# Patient Record
Sex: Male | Born: 2000 | Race: Black or African American | Hispanic: No | Marital: Single | Smoking: Never smoker
Health system: Southern US, Community
[De-identification: ages and names within clinical notes are randomized; demographics above are authoritative.]

---
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
## 2020-02-02 ENCOUNTER — Other Ambulatory Visit: Payer: Self-pay

## 2020-02-02 ENCOUNTER — Encounter (HOSPITAL_COMMUNITY): Payer: Self-pay

## 2020-02-02 ENCOUNTER — Ambulatory Visit (HOSPITAL_COMMUNITY)
Admission: EM | Admit: 2020-02-02 | Discharge: 2020-02-02 | Disposition: A | Discharge status: Home / Self Care | Payer: Medicaid Other | Attending: Family Medicine | Admitting: Family Medicine

## 2020-02-02 DIAGNOSIS — Z20822 Contact with and (suspected) exposure to covid-19: Secondary | ICD-10-CM | POA: Diagnosis not present

## 2020-02-02 DIAGNOSIS — Z01812 Encounter for preprocedural laboratory examination: Secondary | ICD-10-CM | POA: Diagnosis not present

## 2020-02-02 DIAGNOSIS — Z1152 Encounter for screening for COVID-19: Secondary | ICD-10-CM

## 2020-02-02 LAB — SARS CORONAVIRUS 2 (TAT 6-24 HRS): SARS Coronavirus 2: NEGATIVE

## 2020-02-02 NOTE — ED Triage Notes (Signed)
Pt presents for covid testing for work with no known symptoms. 

## 2020-02-02 NOTE — Discharge Instructions (Signed)
If your Covid-19 test is positive, you will get a phone call from [facility] regarding your results. If your Covid-19 test is negative, you will NOT get a phone call from  with your results. You may view your results on MyChart. If you do not have a MyChart account, sign up instructions are in your discharge papers. ° °

## 2020-03-10 ENCOUNTER — Encounter (HOSPITAL_COMMUNITY): Payer: Self-pay | Admitting: Emergency Medicine

## 2020-03-10 ENCOUNTER — Other Ambulatory Visit: Payer: Self-pay

## 2020-03-10 ENCOUNTER — Ambulatory Visit (HOSPITAL_COMMUNITY)
Admission: EM | Admit: 2020-03-10 | Discharge: 2020-03-10 | Disposition: A | Discharge status: Home / Self Care | Payer: Medicaid Other | Attending: Family Medicine | Admitting: Family Medicine

## 2020-03-10 DIAGNOSIS — Z1152 Encounter for screening for COVID-19: Secondary | ICD-10-CM

## 2020-03-10 DIAGNOSIS — Z20822 Contact with and (suspected) exposure to covid-19: Secondary | ICD-10-CM | POA: Insufficient documentation

## 2020-03-10 LAB — SARS CORONAVIRUS 2 (TAT 6-24 HRS): SARS Coronavirus 2: NEGATIVE

## 2020-03-10 NOTE — ED Triage Notes (Signed)
Pt presents for COVID test. States was exposed at work. Denies any symptoms at this time.

## 2020-03-10 NOTE — Discharge Instructions (Signed)

## 2020-04-21 DIAGNOSIS — Z20822 Contact with and (suspected) exposure to covid-19: Secondary | ICD-10-CM | POA: Diagnosis not present

## 2020-08-03 ENCOUNTER — Ambulatory Visit (HOSPITAL_COMMUNITY)
Admission: EM | Admit: 2020-08-03 | Discharge: 2020-08-03 | Disposition: A | Discharge status: Home / Self Care | Payer: Medicaid Other | Attending: Medical Oncology | Admitting: Medical Oncology

## 2020-08-03 ENCOUNTER — Encounter (HOSPITAL_COMMUNITY): Payer: Self-pay | Admitting: Emergency Medicine

## 2020-08-03 DIAGNOSIS — Z113 Encounter for screening for infections with a predominantly sexual mode of transmission: Secondary | ICD-10-CM | POA: Insufficient documentation

## 2020-08-03 LAB — HIV ANTIBODY (ROUTINE TESTING W REFLEX): HIV Screen 4th Generation wRfx: NONREACTIVE

## 2020-08-03 NOTE — ED Triage Notes (Signed)
Pt is present today for STD testing. Pt denies any sx  

## 2020-08-03 NOTE — ED Provider Notes (Signed)
[facility]    CSN: [id number] Arrival date & time: 08/03/20  4580      History   Chief Complaint Chief Complaint  Patient presents with   SEXUALLY TRANSMITTED DISEASE    HPI Benjamin Pacheco is a 20 y.o. male.   HPI  STI screening: Patient reports for STI screening.  He denies any known contacts or flulike symptoms, penile discharge or pelvic pain.  History reviewed. No pertinent past medical history.  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 No pertinent surgical history.    Home Medications    Prior to Admission medications   Medication Sig Start Date End Date Taking? Authorizing Provider  metroNIDAZOLE (FLAGYL) 500 MG tablet Take 1 tablet (500 mg total) by mouth 2 (two) times daily. 12/07/19   Merrilee Jansky, MD  fluticasone (FLONASE) 50 MCG/ACT nasal spray Place 2 sprays into both nostrils daily. 08/02/19 10/28/19  Meccariello, Solmon Ice, DO    Family History Family History  Family history unknown: Yes    Social History Social History   Tobacco Use   Smoking status: Never   Smokeless tobacco: Never  Substance Use Topics   Alcohol use: No    Alcohol/week: 0.0 standard drinks   Drug use: No     Allergies   Patient has no known allergies.   Review of Systems Review of Systems  As stated above in HPI Physical Exam Triage Vital Signs ED Triage Vitals [08/03/20 0922]  Enc Vitals Group     BP 118/75     Pulse Rate 67     Resp 18     Temp 98.5 F (36.9 C)     Temp Source Oral     SpO2 100 %     Weight      Height      Head Circumference      Peak Flow      Pain Score 0     Pain Loc      Pain Edu?      Excl. in GC?    No data found.  Updated Vital Signs BP 118/75 (BP Location: Right  Arm)   Pulse 67   Temp 98.5 F (36.9 C) (Oral)   Resp 18   SpO2 100%   Physical Exam Vitals and nursing note reviewed.  Constitutional:      General: He is not in acute distress.    Appearance: Normal appearance. He is not ill-appearing, toxic-appearing or diaphoretic.  Cardiovascular:     Rate and Rhythm: Normal rate and regular rhythm.     Heart sounds: Normal heart sounds.  Pulmonary:     Effort: Pulmonary effort is normal.     Breath sounds: Normal breath sounds.  Lymphadenopathy:     Cervical: No cervical adenopathy.  Neurological:     Mental Status: He is alert.     UC Treatments / Results  Labs (all labs ordered are listed, but only abnormal results are displayed) Labs Reviewed - No data to display  EKG   Radiology No results found.  Procedures Procedures (including critical care time)  Medications Ordered in UC Medications - No data to display  Initial Impression / Assessment and Plan / UC Course  I have  reviewed the triage vital signs and the nursing notes.  Pertinent labs & imaging results that were available during my care of the patient were reviewed by me and considered in my medical decision making (see chart for details).     New.  Screening pending.  Discussed safe sex practices with patient. Final Clinical Impressions(s) / UC Diagnoses   Final diagnoses:  None   Discharge Instructions   None    ED Prescriptions   None    PDMP not reviewed this encounter.   Rushie Chestnut, [place] 08/03/20 1012

## 2020-08-04 LAB — RPR: RPR Ser Ql: NONREACTIVE

## 2020-08-05 LAB — CYTOLOGY, (ORAL, ANAL, URETHRAL) ANCILLARY ONLY
Chlamydia: NEGATIVE
Comment: NEGATIVE
Comment: NEGATIVE
Comment: NORMAL
Neisseria Gonorrhea: NEGATIVE
Trichomonas: NEGATIVE

## 2020-10-02 ENCOUNTER — Ambulatory Visit (INDEPENDENT_AMBULATORY_CARE_PROVIDER_SITE_OTHER): Payer: Medicaid Other | Admitting: Family Medicine

## 2020-10-02 ENCOUNTER — Other Ambulatory Visit: Payer: Self-pay

## 2020-10-02 VITALS — BP 112/73 | HR 71 | Temp 99.0°F | Ht 69.0 in

## 2020-10-02 DIAGNOSIS — J069 Acute upper respiratory infection, unspecified: Secondary | ICD-10-CM

## 2020-10-02 DIAGNOSIS — L700 Acne vulgaris: Secondary | ICD-10-CM | POA: Diagnosis not present

## 2020-10-02 MED ORDER — CLINDAMYCIN PHOS-BENZOYL PEROX 1.2-5 % EX GEL: CUTANEOUS | 0 refills | Status: DC

## 2020-10-02 NOTE — Patient Instructions (Addendum)
It was great to see you! Thank you for allowing me to participate in your care!  Our plans for today:  -We are checking COVID-19 testing.  We should get the result back in 1 to 2 days and I will let you know the result when it returns. -If you develop any chest pains, trouble breathing, shortness of breath, vomiting to the extent that she cannot keep down fluids, or any other concerning symptoms I would like you to immediately let us know or go to the emergency department. -I am prescribing a acne cream for you to use on your face twice per day.  I would like for you to follow-up with [place] in 1 to 2 months to evaluate how it is working.  If it is not working well we can always increase the potency of the medication.   Take care and seek immediate care sooner if you develop any concerns.   Dr. Jackelyn Poling, DO [facility]

## 2020-10-02 NOTE — Progress Notes (Signed)
    SUBJECTIVE:   CHIEF COMPLAINT / HPI:   Cold-like symptoms: 20 year old male with cough, congestion, runny nose that started on Sunday.  He denies fevers, chills, vomiting, diarrhea.  He does not know of any sick contacts.  Acne vulgaris Patient with a history of acne.  States he has been breaking out worse during this cold-like event.  He is interested in starting a medication for his acne.  Physical exam is consistent with acne vulgaris.  PERTINENT  PMH / PSH: None relevant  OBJECTIVE:   BP 112/73   Pulse 71   Temp 99 F (37.2 C) (Oral)   Ht 5\' 9"  (1.753 m)   SpO2 100%    General: NAD, pleasant, able to participate in exam HEENT: No pharyngeal erythema, moist mucous membranes Cardiac: RRR, no murmurs. Respiratory: CTAB, normal effort, No wheezes, rales or rhonchi Abdomen: Bowel sounds present, nontender Skin: Acne vulgaris present on the cheeks bilaterally with whiteheads and blackheads present, minimal to no scarring present.  ASSESSMENT/PLAN:   Acne vulgaris We will start with BenzaClin gel.  I recommended she follow-up in about 1 to 2 months to evaluate how it is working.  We can always increase the treatment if needed.   Viral URI with cough: Assessment: 20 y.o. male with symptoms consistent with a viral upper respiratory tract infection.  Patient does not have any concerning symptoms.  No shortness of breath.  Overall differential can include seasonal allergies with postnasal drip leading to the cough and runny nose versus viral URI which can include common cold, influenza, COVID-19, or number of other respiratory viruses.  Unlikely bacterial at this time though the patient could potentially develop sinus infection due to congestion. Plan: -We will send for COVID-19 testing -Discussed return precautions -Discussed symptomatic treatment and the lack of need for antibiotics.  26, DO [facility]

## 2020-10-02 NOTE — Assessment & Plan Note (Signed)
We will start with BenzaClin gel.  I recommended she follow-up in about 1 to 2 months to evaluate how it is working.  We can always increase the treatment if needed.

## 2020-10-04 ENCOUNTER — Telehealth: Payer: Self-pay | Admitting: Family Medicine

## 2020-10-04 LAB — NOVEL CORONAVIRUS, NAA: SARS-CoV-2, NAA: DETECTED — AB

## 2020-10-04 LAB — SARS-COV-2, NAA 2 DAY TAT

## 2020-10-04 NOTE — Telephone Encounter (Signed)
Attempted to call patient to discuss positive COVID-19 result.  Was not able to get the patient so left a HIPAA compliant voicemail stating that I will send a MyChart message in regard to the phone call.  I have sent the patient MyChart message recommending isolation per CDC guidelines.  Also recommended patient follow-up if he has any questions and provide return precautions.

## 2021-03-08 ENCOUNTER — Encounter (HOSPITAL_COMMUNITY): Payer: Self-pay

## 2021-03-08 ENCOUNTER — Ambulatory Visit (HOSPITAL_COMMUNITY)
Admission: EM | Admit: 2021-03-08 | Discharge: 2021-03-08 | Disposition: A | Discharge status: Home / Self Care | Payer: Medicaid Other | Attending: Urgent Care | Admitting: Urgent Care

## 2021-03-08 ENCOUNTER — Other Ambulatory Visit: Payer: Self-pay

## 2021-03-08 DIAGNOSIS — Z7251 High risk heterosexual behavior: Secondary | ICD-10-CM | POA: Diagnosis not present

## 2021-03-08 DIAGNOSIS — Z202 Contact with and (suspected) exposure to infections with a predominantly sexual mode of transmission: Secondary | ICD-10-CM | POA: Insufficient documentation

## 2021-03-08 MED ORDER — DOXYCYCLINE HYCLATE 100 MG PO CAPS: 100.0000 mg | ORAL_CAPSULE | Freq: Two times a day (BID) | ORAL | 0 refills | Status: DC

## 2021-03-08 NOTE — ED Triage Notes (Signed)
Pt presents for STD screening.   States he does not have sxs.   States he wants to take pills for treatment.

## 2021-03-08 NOTE — Discharge Instructions (Addendum)
Avoid all forms of sexual intercourse (oral, vaginal, anal) for the next 7 days to avoid spreading/reinfecting or at least until we can see what kinds of infection results are positive. Return if symptoms worsen/do not resolve, you develop fever, abdominal pain, blood in your urine, or are re-exposed to a sexually transmitted infection (STI).  

## 2021-03-08 NOTE — ED Provider Notes (Signed)
°  [place]   MRN: [id number] DOB: 2000-12-14  Subjective:   Benjamin Pacheco is a 21 y.o. male presenting for STI treatment.  Had exposure to chlamydia.  Uses condoms for protection but the last time he had sex, it broke.  His partner ended up getting tested and she was positive for chlamydia. Denies dysuria, hematuria, urinary frequency, penile discharge, penile swelling, testicular pain, testicular swelling, anal pain, groin pain.   No current facility-administered medications for this encounter.  Current Outpatient Medications:    Clindamycin-Benzoyl Per, Refr, gel, Use twice daily for acne, Disp: 45 g, Rfl: 0   metroNIDAZOLE (FLAGYL) 500 MG tablet, Take 1 tablet (500 mg total) by mouth 2 (two) times daily., Disp: 14 tablet, Rfl: 0   No Known Allergies  History reviewed. No pertinent past medical history.   History reviewed. No pertinent surgical history.  Family History  Family history unknown: Yes    Social History   Tobacco Use   Smoking status: Never   Smokeless tobacco: Never  Substance Use Topics   Alcohol use: No    Alcohol/week: 0.0 standard drinks   Drug use: No    ROS   Objective:   Vitals: BP 113/71 (BP Location: Left Arm)    Pulse 80    Temp 98.1 F (36.7 C) (Oral)    Resp 18    SpO2 97%   Physical Exam Constitutional:      General: He is not in acute distress.    Appearance: Normal appearance. He is well-developed and normal weight. He is not ill-appearing, toxic-appearing or diaphoretic.  HENT:     Head: Normocephalic and atraumatic.     Right Ear: External ear normal.     Left Ear: External ear normal.     Nose: Nose normal.     Mouth/Throat:     Pharynx: Oropharynx is clear.  Eyes:     General: No scleral icterus.       Right eye: No discharge.        Left eye: No discharge.     Extraocular Movements: Extraocular movements intact.  Cardiovascular:     Rate and Rhythm: Normal rate.  Pulmonary:     Effort: Pulmonary  effort is normal.  Musculoskeletal:     Cervical back: Normal range of motion.  Neurological:     Mental Status: He is alert and oriented to person, place, and time.  Psychiatric:        Mood and Affect: Mood normal.        Behavior: Behavior normal.        Thought Content: Thought content normal.        Judgment: Judgment normal.    Assessment and Plan :   PDMP not reviewed this encounter.  1. Unprotected sex   2. Exposure to chlamydia    Given his exposure will treat for chlamydia with doxycycline.  Recommended abstaining for a week.  Labs pending, will treat as appropriate. Counseled patient on potential for adverse effects with medications prescribed/recommended today, ER and return-to-clinic precautions discussed, patient verbalized understanding.    Jaynee Eagles, PA-C 03/08/21 1423

## 2021-03-11 LAB — CYTOLOGY, (ORAL, ANAL, URETHRAL) ANCILLARY ONLY
Chlamydia: POSITIVE — AB
Comment: NEGATIVE
Comment: NEGATIVE
Comment: NORMAL
Neisseria Gonorrhea: NEGATIVE
Trichomonas: NEGATIVE

## 2021-05-21 NOTE — Progress Notes (Addendum)
? ? ?  SUBJECTIVE:  ? ?CHIEF COMPLAINT / HPI:  ? ?Encounter for physical exam: ?21 year old male present for encounter for physical exam prior to joining the [employer]. He takes no medications and has no past medical history. He has never had a surgery. He denies alcohol, tobacco, or drugs.  He denies any concerns for the need for STD testing.  He denies any history of syncope during athletic events.  States he did play sports in high school.  Denies any history of any family members dropping dead suddenly during athletics. ? ?States he had a heart murmur as a child but saw a cardiologist and was cleared. Additionally he had a wrist fracture as a child which healed well and has had no complications from it.  ? ?PERTINENT  PMH / PSH: None ? ?OBJECTIVE:  ? ?BP 110/70   Pulse 63   Ht 5\' 9"  (1.753 m)   Wt 165 lb (74.8 kg)   SpO2 100%   BMI 24.37 kg/m?   ? ?General: NAD, pleasant, able to participate in exam ?HEENT: No pharyngeal erythema, no cervical lymphadenopathy ?Cardiac: RRR, no murmurs ?Respiratory: CTAB, normal effort, No wheezes, rales or rhonchi ?Abdomen: Bowel sounds present, nontender, nondistended, no hepatosplenomegaly. ?Extremities: no edema or cyanosis. ?MSK: Normal bulk and tone, no pain with palpation of either wrist, strength 5/5 in upper lower extremities bilaterally ?Neuro: alert, no obvious focal deficits ?Psych: Normal affect and mood ? ?ASSESSMENT/PLAN:  ? ?  ?Annual physical: ?21 year old male present for annual physical.  He has no concerns today.  He states he is having physical because he is about to join [employer] and needs to have a doctor's physical as well as his medical records.  States that when he was young he had a heart murmur and saw pediatric cardiologist and was cleared.  Additionally states he fractured his wrist years ago and also saw a physician with [facility] and was cleared.  He states that he does need records from these events but is unsure which physician with  which group he saw.  He states he may have seen at [facility] cardiologist for the heart murmur.  I do not hear a heart murmur on him today.  He states the [facility] does need these records.  I discussed that I can release records related to him at our clinic by having him fill out a form upfront but that he would need to reach out to the doctors that saw him for the other concerns to get their records if that is with the [employer] is required.  He denies any other concerns. ?Per recommendations we will screen for hepatitis C. ? ?[employer], DO ?[facility]  ? ? ? ?

## 2021-05-22 ENCOUNTER — Encounter: Payer: Self-pay | Admitting: Family Medicine

## 2021-05-22 ENCOUNTER — Ambulatory Visit (INDEPENDENT_AMBULATORY_CARE_PROVIDER_SITE_OTHER): Payer: Medicaid Other | Admitting: Family Medicine

## 2021-05-22 ENCOUNTER — Telehealth: Payer: Self-pay | Admitting: Student

## 2021-05-22 VITALS — BP 110/70 | HR 63 | Ht 69.0 in | Wt 165.0 lb

## 2021-05-22 DIAGNOSIS — Z0000 Encounter for general adult medical examination without abnormal findings: Secondary | ICD-10-CM | POA: Diagnosis not present

## 2021-05-22 NOTE — Patient Instructions (Signed)
For your records we can release only the records relating to this clinic.  You can talk with the front desk to fill out a records release form for that.  You will need to see your doctor who cleared you from the previous 2 items (your wrist fracture and your heart murmur) to get their documentation.  We will screen for hepatitis C today as is recommended for adults. ?

## 2021-05-22 NOTE — Telephone Encounter (Signed)
Pt has a history of heart murmur and needs a referral to an adult heart specialist. Please mother of patient [phone]. ?

## 2021-05-23 LAB — HCV INTERPRETATION

## 2021-05-23 LAB — HCV AB W REFLEX TO QUANT PCR: HCV Ab: NONREACTIVE

## 2021-05-24 ENCOUNTER — Other Ambulatory Visit: Payer: Self-pay | Admitting: Student

## 2021-05-24 ENCOUNTER — Encounter: Payer: Self-pay | Admitting: Student

## 2021-05-24 DIAGNOSIS — Z8679 Personal history of other diseases of the circulatory system: Secondary | ICD-10-CM

## 2021-05-24 NOTE — Telephone Encounter (Signed)
Patient has returned saying he needs the referral, hopefully, can get an appointment by Monday. Need for [employer] entry.    ?

## 2021-05-27 DIAGNOSIS — M25531 Pain in right wrist: Secondary | ICD-10-CM | POA: Diagnosis not present

## 2021-05-28 ENCOUNTER — Encounter: Payer: Self-pay | Admitting: Internal Medicine

## 2021-05-28 ENCOUNTER — Ambulatory Visit (INDEPENDENT_AMBULATORY_CARE_PROVIDER_SITE_OTHER): Payer: Medicaid Other | Admitting: Internal Medicine

## 2021-05-28 VITALS — BP 116/62 | HR 52 | Ht 69.0 in | Wt 167.2 lb

## 2021-05-28 DIAGNOSIS — Q231 Congenital insufficiency of aortic valve: Secondary | ICD-10-CM

## 2021-05-28 DIAGNOSIS — Q245 Malformation of coronary vessels: Secondary | ICD-10-CM | POA: Diagnosis not present

## 2021-05-28 LAB — BASIC METABOLIC PANEL
BUN/Creatinine Ratio: 15 (ref 9–20)
BUN: 12 mg/dL (ref 6–20)
CO2: 27 mmol/L (ref 20–29)
Calcium: 10 mg/dL (ref 8.7–10.2)
Chloride: 99 mmol/L (ref 96–106)
Creatinine, Ser: 0.81 mg/dL (ref 0.76–1.27)
Glucose: 82 mg/dL (ref 70–99)
Potassium: 4.7 mmol/L (ref 3.5–5.2)
Sodium: 138 mmol/L (ref 134–144)
eGFR: 129 mL/min/{1.73_m2} (ref >59)

## 2021-05-28 NOTE — Progress Notes (Signed)
?Cardiology Office Note:   ? ?Date:  05/28/2021  ? ?ID:  Benjamin Pacheco, DOB [birth year], MRN [id number] ? ?PCP:  Gerrit Heck, MD ?  ?[facility] Providers ?Cardiologist:  Janina Mayo, MD    ? ?Referring MD: Gerrit Heck, MD  ? ?No chief complaint on file. ?Military Clearance ? ?History of Present Illness:   ? ?Benjamin Pacheco is a 21 y.o. male with a hx of BAV, and anomalous origin of the RCA previously followed by [place] [facility] last seen 09/2016, referral from PCP for clearance for the [employer] ? ?He is Asymptomatic. No other medical problems. He had a heart murmur per his mother.  ? ?Patient of [facility]. 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  He did not have any aortic stenosis, insufficiency, or dilation of the ascending aorta at the time of his diagnosis. The right coronary artery was noted to have a high leftward origin. He did undergo cardiac stress testing that was performed at [facility] on 10/19/15. He had moderately reduced functional capacity that was felt to be due to airway disease. FEV1 showed 26% reduction after exercise and remained reduced for at least 20 minutes. He did not have any chest pain. There was no ST segment changes or arrhythmias noted. He did undergo cardiac MRI on 11/13/15. Due to the fusion between his right and left thousand difficulty determining the precise location raphae on MRI the precise location of his right coronary artery was difficult to determine. The coronary artery did have an anterior origin off of the aorta there was high and leftward. Considering CMR was not optimal for assessment of anomalous RCA a cardiac CT was recommended. Don't see the cardiac CT. ? ?He played soccer, wrestling, basketball and volleyball and had no issues. No hx of asthma per patient ? ?Family Hx: No heart  disease history. Sbiling with asthma ? ?Social Hx; No smoking cigarettes, no drug use ? ? ?TTE- Pediatric Echo 10/16/2016 ? ? Functionally bicuspid aortic valve. ? No aortic stenosis. ? No aortic insufficiency. ? 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 ? ?CARDIAC POSITION ? Levocardia. Abdominal situs solitus. Atrial situs solitus. D Ventricular Loop. S Normal ? position great vessels. ? ?VEINS ? Normal systemic venous connections. One right and one left sided pulmonary vein are ? confirmed draining normally to the left atrium. Normal pulmonary vein velocity. ? ?ATRIA ? Normal right atrial size. Normal left atrial size. The atrial septum is not well seen in ? this study, cannot rule out small defects or a patent foramen ovale. ? ?ATRIOVENTRICULAR VALVES ? Normal tricuspid valve. No tricuspid valve stenosis. Trace tricuspid valve ? regurgitation. The TR peak gradient = 22 mmHg. Normal mitral valve. No mitral valve ? stenosis. Trace mitral valve regurgitation. ? ?VENTRICLES ? Normal right ventricle structure and size. Normal left ventricle structure and size. ? Intact ventricular septum. ? ?CARDIAC FUNCTION ? Normal right ventricular systolic function. Normal left ventricular systolic function. ? ?SEMILUNAR VALVES ? Normal pulmonic valve. Normal pulmonic valve velocity. Trivial pulmonary valve ? insufficiency. There is partial fusion of the right/left commissure of the aortic valve. ? No aortic valve insufficiency by color Doppler. The peak instantaneous pressure gradient ? across the aortic valve by Doppler is 13 mm Hg. ? ?CORONARY ARTERIES ? The right coronary is seen,  but the ostium is not well defined. Normal origin and ? proximal course of the left coronary artery with prograde flow demonstrated by color ? Doppler. ? ?GREAT ARTERIES ? Left aortic arch with normal branching pattern. No evidence of coarctation of the aorta. ? No aortic root dilation. There is normal pulsatility of the  abdominal aorta. Normal main ? pulmonary artery and pulmonary artery branches. ? ?SHUNTS ? No patent ductus arteriosus. ? ?EXTRACARDIAC ? No pericardial effusion. ? ? ? ?Current Medications: ?Current Outpatient Medications on File Prior to Visit  ?Medication Sig Dispense Refill  ? [DISCONTINUED] fluticasone (FLONASE) 50 MCG/ACT nasal spray Place 2 sprays into both nostrils daily. 16 g 0  ? ?No current facility-administered medications on file prior to visit.  ? ? ? ?Allergies:   Patient has no known allergies.  ? ?Social History  ? ?Socioeconomic History  ? Marital status: Single  ?  Spouse name: Not on file  ? Number of children: Not on file  ? Years of education: Not on file  ? Highest education level: Not on file  ?Occupational History  ? Not on file  ?Tobacco Use  ? Smoking status: Never  ? Smokeless tobacco: Never  ?Substance and Sexual Activity  ? Alcohol use: No  ?  Alcohol/week: 0.0 standard drinks  ? Drug use: No  ? Sexual activity: Not on file  ?Other Topics Concern  ? Not on file  ?Social History Narrative  ? Not on file  ? ?Social Determinants of Health  ? ?Financial Resource Strain: Not on file  ?Food Insecurity: Not on file  ?Transportation Needs: Not on file  ?Physical Activity: Not on file  ?Stress: Not on file  ?Social Connections: Not on file  ?  ? ?Family History: ?The patient's  ? ? High blood pressure (Hypertension) Father  ? Diabetes Father  ? Iron deficiency Sister  ? Cancer Maternal Grandmother  ?Lung  ? Alzheimer's disease Maternal Grandfather  ? Sudden death (unexpected death due to unknown cause) Maternal Uncle 3  ? ?ROS:   ?Please see the history of present illness.    ? All other systems reviewed and are negative. ? ?EKGs/Labs/Other Studies Reviewed:   ? ?The following studies were reviewed today: ? ? ?EKG:  EKG is  ordered today.  The ekg ordered today demonstrates  ? ?EKG 05/28/2021- NSR, septal q ? ?Recent Labs: ?No results found for requested labs within last 8760 hours.  ?Recent Lipid  Panel ?No results found for: CHOL, TRIG, HDL, CHOLHDL, VLDL, LDLCALC, LDLDIRECT ? ? ?Risk Assessment/Calculations:   ?  ? ?    ? ?Physical Exam:   ? ?VS:  ? ?Vitals:  ? 05/28/21 1025  ?BP: 116/62  ?Pulse: (!) 52  ?SpO2: 99%  ? ? ? ?Wt Readings from Last 3 Encounters:  ?05/28/21 167 lb 3.2 oz (75.8 kg)  ?05/22/21 165 lb (74.8 kg)  ?11/20/17 151 lb (68.5 kg) (58 %, Z= 0.21)*  ? ?* Growth percentiles are based on CDC (Boys, 2-20 Years) data.  ?  ? ?GEN:  Well nourished, well developed in no acute distress ?HEENT: Normal ?NECK: No JVD; No carotid bruits ?LYMPHATICS: No lymphadenopathy ?CARDIAC: RRR, no murmurs, rubs, gallops ?RESPIRATORY:  Clear to auscultation without rales, wheezing or rhonchi  ?ABDOMEN: Soft, non-tender, non-distended ?MUSCULOSKELETAL:  No edema; No deformity  ?SKIN: Warm and dry ?NEUROLOGIC:  Alert and oriented x 3 ?PSYCHIATRIC:  Normal affect  ? ?ASSESSMENT:   ? ?#BAV: noted to have partial fusion of L/R  coronary cusp. No systolic murmur on exam. He is asymptomatic. Will plan for TTE for surveillance. ? ?#Anomalous RCA: still requires further evaluation. Do not see cardiac CT done at [facility]. Will plan to get a cardiac CT to assess. It is typically benign condition. It is reassuring his stress testing was not c/f ischemia. ? ?If his cardiac CT shows benign anomalous RCA and BAV does not show significant obstruction then he can be cleared for [employer] with recommendation for continued cardiac surveillance.  ? ? ?PLAN:   ? ?In order of problems listed above: ? ?TTE ?Cardiac CTA ?Follow up 6 months ? ?   ? ?   ?Medication Adjustments/Labs and Tests Ordered: ?Current medicines are reviewed at length with the patient today.  Concerns regarding medicines are outlined above.  ?Orders Placed This Encounter  ?Procedures  ? CT CORONARY MORPH W/CTA COR W/SCORE W/CA W/CM &/OR WO/CM  ? Basic metabolic panel  ? ECHOCARDIOGRAM COMPLETE  ? ?No orders of the defined types were placed in this  encounter. ? ? ?Patient Instructions  ?Medication Instructions:  ?No Changes In Medications at this time.  ?*If you need a refill on your cardiac medications before your next appointment, please call your pharmacy* ? ?Lab Work:

## 2021-05-28 NOTE — Patient Instructions (Addendum)
Medication Instructions:  ?No Changes In Medications at this time.  ?*If you need a refill on your cardiac medications before your next appointment, please call your pharmacy* ? ?Lab Work: ?BMET- TODAY  ?If you have labs (blood work) drawn today and your tests are completely normal, you will receive your results only by: ?MyChart Message (if you have MyChart) OR ?A paper copy in the mail ?If you have any lab test that is abnormal or we need to change your treatment, we will call you to review the results. ? ?Testing/Procedures: ?Your physician has requested that you have an echocardiogram. Echocardiography is a painless test that uses sound waves to create images of your heart. It provides your doctor with information about the size and shape of your heart and how well your heart?s chambers and valves are working. You may receive an ultrasound enhancing agent through an IV if needed to better visualize your heart during the echo.This procedure takes approximately one hour. There are no restrictions for this procedure. This will take place at the [address].   ? ?Your physician has requested that you have cardiac CT. Cardiac computed tomography (CT) is a painless test that uses an x-ray machine to take clear, detailed pictures of your heart. For further information please visit [email or web address] Please follow instruction sheet as given. ? ?Follow-Up: ?At [facility], you and your health needs are our priority.  As part of our continuing mission to provide you with exceptional heart care, we have created designated Provider Care Teams.  These Care Teams include your primary Cardiologist (physician) and Advanced Practice Providers (APPs -  Physician Assistants and Nurse Practitioners) who all work together to provide you with the care you need, when you need it. ? ?Your next appointment:   ?6 month(s) ? ?The format for your next appointment:   ?In Person ? ?Provider:   ?Maisie Fus, MD    ? ?Other Instructions ? ? ?Your cardiac CT will be scheduled at one of the below locations:  ? ?[facility] ?[address] ?[place], [place] [address] ?[phone] ? ?If scheduled at [facility], please arrive at the [facility] and Children's Entrance (Entrance C2) of [facility] 30 minutes prior to test start time. ?You can use the FREE valet parking offered at entrance C (encouraged to control the heart rate for the test)  ?Proceed to the [facility] Radiology Department (first floor) to check-in and test prep. ? ?All radiology patients and guests should use entrance C2 at [facility], accessed from [facility], even though the hospital's physical address listed is [address] ? ? ? ?Please follow these instructions carefully (unless otherwise directed): ? ?On the Night Before the Test: ?Be sure to Drink plenty of water. ?Do not consume any caffeinated/decaffeinated beverages or chocolate 12 hours prior to your test. ?Do not take any antihistamines 12 hours prior to your test. ? ?On the Day of the Test: ?Drink plenty of water until 1 hour prior to the test. ?Do not eat any food 4 hours prior to the test. ?You may take your regular medications prior to the test.  ?Take metoprolol (Lopressor) two hours prior to test. ?HOLD Furosemide/Hydrochlorothiazide morning of the test. ? ?After the Test: ?Drink plenty of water. ?After receiving IV contrast, you may experience a mild flushed feeling. This is normal. ?On occasion, you may experience a mild rash up to 24 hours after the test. This is  not dangerous. If this occurs, you can take Benadryl 25 mg and increase your fluid intake. ?If you experience trouble breathing, this can be serious. If it is severe call 911 IMMEDIATELY. If it is mild, please call our office. ?If you take any of these medications: Glipizide/Metformin, Avandament, Glucavance, please do not take 48 hours after completing test unless otherwise  instructed. ? ?We will call to schedule your test 2-4 weeks out understanding that some insurance companies will need an authorization prior to the service being performed.  ? ?For non-scheduling related questions, please contact the cardiac imaging nurse navigator should you have any questions/concerns: ?Rockwell Alexandria, Cardiac Imaging Nurse Navigator ?Larey Brick, Cardiac Imaging Nurse Navigator ?[facility] ?Direct Office Dial: [phone]  ? ?For scheduling needs, including cancellations and rescheduling, please call [place], [phone]. ?  ?

## 2021-05-29 NOTE — Addendum Note (Signed)
Addended by: Johney Frame A on: 05/29/2021 12:57 PM ? ? Modules accepted: Orders ? ?

## 2021-06-07 ENCOUNTER — Telehealth: Payer: Self-pay | Admitting: Student

## 2021-06-07 NOTE — Telephone Encounter (Signed)
Letter sent to inform of cardiology referral.  ? ?Bryston Colocho Geophysical data processor  ?

## 2021-06-17 ENCOUNTER — Ambulatory Visit (HOSPITAL_COMMUNITY): Payer: Medicaid Other | Attending: Internal Medicine

## 2021-06-17 DIAGNOSIS — Q231 Congenital insufficiency of aortic valve: Secondary | ICD-10-CM | POA: Diagnosis not present

## 2021-06-17 DIAGNOSIS — Q245 Malformation of coronary vessels: Secondary | ICD-10-CM | POA: Insufficient documentation

## 2021-06-17 LAB — ECHOCARDIOGRAM COMPLETE
Area-P 1/2: 3.74 cm2
S' Lateral: 2.5 cm

## 2021-06-19 ENCOUNTER — Telehealth (HOSPITAL_COMMUNITY): Payer: Self-pay | Admitting: Emergency Medicine

## 2021-06-19 NOTE — Telephone Encounter (Signed)
Reaching out to patient to offer assistance regarding upcoming cardiac imaging study; pt verbalizes understanding of appt date/time, parking situation and where to check in, pre-test NPO status and medications ordered, and verified current allergies; name and call back number provided for further questions should they arise ?Marchia Bond RN Navigator Cardiac Imaging ?[facility] ?[phone] office ?[phone] cell ? ?Arrival 330 ?Difficult Iv  ? ?

## 2021-06-20 ENCOUNTER — Ambulatory Visit (HOSPITAL_COMMUNITY): Admission: RE | Admit: 2021-06-20 | Payer: Medicaid Other | Source: Ambulatory Visit

## 2021-07-30 ENCOUNTER — Encounter: Payer: Self-pay | Admitting: *Deleted

## 2021-08-07 ENCOUNTER — Telehealth (HOSPITAL_COMMUNITY): Payer: Self-pay | Admitting: *Deleted

## 2021-08-07 NOTE — Telephone Encounter (Signed)
Reaching out to patient to offer assistance regarding upcoming cardiac imaging study; pt verbalizes understanding of appt date/time, parking situation and where to check in, pre-test NPO status and verified current allergies; name and call back number provided for further questions should they arise  Chealsea Paske RN Navigator Cardiac Imaging [facility] [phone] office [phone] cell  Patient aware to arrive at 12pm. 

## 2021-08-08 ENCOUNTER — Ambulatory Visit (HOSPITAL_COMMUNITY): Admission: RE | Admit: 2021-08-08 | Payer: Medicaid Other | Source: Ambulatory Visit

## 2021-09-07 ENCOUNTER — Encounter (HOSPITAL_COMMUNITY): Payer: Self-pay | Admitting: Emergency Medicine

## 2021-09-07 ENCOUNTER — Ambulatory Visit (HOSPITAL_COMMUNITY)
Admission: EM | Admit: 2021-09-07 | Discharge: 2021-09-07 | Disposition: A | Discharge status: Home / Self Care | Payer: Medicaid Other | Attending: Internal Medicine | Admitting: Internal Medicine

## 2021-09-07 ENCOUNTER — Other Ambulatory Visit: Payer: Self-pay

## 2021-09-07 DIAGNOSIS — L03211 Cellulitis of face: Secondary | ICD-10-CM

## 2021-09-07 DIAGNOSIS — H02846 Edema of left eye, unspecified eyelid: Secondary | ICD-10-CM | POA: Diagnosis not present

## 2021-09-07 MED ORDER — DOXYCYCLINE HYCLATE 100 MG PO CAPS: 100.0000 mg | ORAL_CAPSULE | Freq: Two times a day (BID) | ORAL | 0 refills | Status: DC

## 2021-09-07 MED ORDER — CEFTRIAXONE SODIUM 1 G IJ SOLR
1.0000 g | Freq: Once | INTRAMUSCULAR | Status: AC
  Administered 2021-09-07: 1 g via INTRAMUSCULAR

## 2021-09-07 MED ORDER — LIDOCAINE HCL (PF) 1 % IJ SOLN
INTRAMUSCULAR | Status: AC
  Filled 2021-09-07: qty 2

## 2021-09-07 MED ORDER — CETIRIZINE HCL 10 MG PO TABS: 10.0000 mg | ORAL_TABLET | Freq: Every day | ORAL | 0 refills | Status: DC

## 2021-09-07 MED ORDER — METHYLPREDNISOLONE SODIUM SUCC 125 MG IJ SOLR
80.0000 mg | Freq: Once | INTRAMUSCULAR | Status: AC
  Administered 2021-09-07: 80 mg via INTRAMUSCULAR

## 2021-09-07 MED ORDER — METHYLPREDNISOLONE SODIUM SUCC 125 MG IJ SOLR
INTRAMUSCULAR | Status: AC
  Filled 2021-09-07: qty 2

## 2021-09-07 MED ORDER — CEFTRIAXONE SODIUM 1 G IJ SOLR
INTRAMUSCULAR | Status: AC
  Filled 2021-09-07: qty 10

## 2021-09-07 NOTE — ED Triage Notes (Addendum)
Left eye swelling above and below left eye.  Woke Friday morning with swelling.  reports intermittent tearing of eye.  denies discharge in lashes.  Patient does not wear contacts.  Denies any changes in vision  Patient has an oozing bump in eyebrow-patient thinks it may be an insect bite

## 2021-09-07 NOTE — Discharge Instructions (Signed)
It appears that you have an infection of your eye.  You were given an antibiotic injection in urgent care as well as steroid injection.  You were sent home with antihistamine allergy medication as well as another antibiotic.  Please start taking these immediately.  Please go to the hospital if symptoms do not improve or if they worsen in the next 24 hours or if you develop fever.  This is very important.

## 2021-09-07 NOTE — ED Provider Notes (Signed)
[facility]    CSN: [id number] Arrival date & time: 09/07/21  1655      History   Chief Complaint Chief Complaint  Patient presents with   Eye Problem    HPI Benjamin Pacheco is a 21 y.o. male.   Patient presents with left upper and lower eyelid swelling that started about 2 days ago.  Patient reports that he thinks that he was bit by something on the lateral portion of his eyebrow a few days prior but is not sure what bit him as he did not witness or feel it.  He has noticed some purulent drainage from the left eyebrow.  Denies any pain to the eye or any pain with extraocular movements.  Denies any purulent drainage from the eye but does report some watery drainage at times.  Denies fever, body aches, chills.  Denies any other trauma or foreign body to the eye.   Eye Problem   History reviewed. No pertinent past medical history.  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 No pertinent surgical history.     Home Medications    Prior to Admission medications   Medication Sig Start Date End Date Taking? Authorizing Provider  cetirizine (ZYRTEC) 10 MG tablet Take 1 tablet (10 mg total) by mouth daily. 09/07/21  Yes Gordie Belvin, Acie Fredrickson, FNP  doxycycline (VIBRAMYCIN) 100 MG capsule Take 1 capsule (100 mg total) by mouth 2 (two) times daily. 09/07/21  Yes Valora Norell, Acie Fredrickson, FNP  fluticasone (FLONASE) 50 MCG/ACT nasal spray Place 2 sprays into both nostrils daily. 08/02/19 10/28/19  Meccariello, Solmon Ice, DO    Family History Family History  Family history unknown: Yes    Social History Social History   Tobacco Use   Smoking status: Never   Smokeless tobacco: Never  Vaping Use   Vaping Use: Never used   Substance Use Topics   Alcohol use: No    Alcohol/week: 0.0 standard drinks of alcohol   Drug use: No     Allergies   Patient has no known allergies.   Review of Systems Review of Systems Per HPI  Physical Exam Triage Vital Signs ED Triage Vitals  Enc Vitals Group     BP 09/07/21 1718 116/70     Pulse Rate 09/07/21 1718 65     Resp 09/07/21 1718 18     Temp 09/07/21 1718 98.2 F (36.8 C)     Temp Source 09/07/21 1718 Oral     SpO2 09/07/21 1718 97 %     Weight --      Height --      Head Circumference --      Peak Flow --      Pain Score 09/07/21 1715 2     Pain Loc --      Pain Edu? --      Excl. in GC? --    No data found.  Updated Vital Signs BP 116/70 (BP Location: Right Arm)   Pulse 65   Temp 98.2 F (36.8 C) (Oral)   Resp 18   SpO2 97%   Visual Acuity Right Eye Distance:   Left Eye Distance:   Bilateral Distance:    Right Eye  Near:   Left Eye Near:    Bilateral Near:     Physical Exam Constitutional:      General: He is not in acute distress.    Appearance: Normal appearance. He is not toxic-appearing or diaphoretic.  HENT:     Head: Normocephalic and atraumatic.  Eyes:     General: Lids are everted, no foreign bodies appreciated. Vision grossly intact. Gaze aligned appropriately.     Extraocular Movements: Extraocular movements intact.     Conjunctiva/sclera: Conjunctivae normal.     Pupils: Pupils are equal, round, and reactive to light.     Comments: Moderate swelling to left upper and lower lid with mild erythema.  There is an area to left lateral eyebrow that has some purulent drainage.  No purulent drainage from eye.  No abnormality to eyeball.  No pain with extraocular movements.  Pulmonary:     Effort: Pulmonary effort is normal.  Neurological:     General: No focal deficit present.     Mental Status: He is alert and oriented to person, place, and time. Mental status is at baseline.  Psychiatric:        Mood and Affect: Mood  normal.        Behavior: Behavior normal.        Thought Content: Thought content normal.        Judgment: Judgment normal.      UC Treatments / Results  Labs (all labs ordered are listed, but only abnormal results are displayed) Labs Reviewed - No data to display  EKG   Radiology No results found.  Procedures Procedures (including critical care time)  Medications Ordered in UC Medications  cefTRIAXone (ROCEPHIN) injection 1 g (1 g Intramuscular Given 09/07/21 1741)  methylPREDNISolone sodium succinate (SOLU-MEDROL) 125 mg/2 mL injection 80 mg (80 mg Intramuscular Given 09/07/21 1741)    Initial Impression / Assessment and Plan / UC Course  I have reviewed the triage vital signs and the nursing notes.  Pertinent labs & imaging results that were available during my care of the patient were reviewed by me and considered in my medical decision making (see chart for details).     There is low suspicion for orbital cellulitis but patient was advised that the best course of action would be to go to the hospital to rule this out with CT imaging given significant swelling on exam.  Patient declined going to the hospital.  Risks associated with not going to the hospital discussed with patient.  Since patient is not going to go to the hospital, will treat with IM Rocephin and IM steroid here in urgent care.  Patient was sent home with antihistamine and doxycycline given suspicion of facial cellulitis due to possible insect bite.  Patient was given strict ER precautions and advised to go to the ER if symptoms do not improve or if they worsen in the next 24 hours.  Visual acuity appears normal.  Patient verbalized understanding and was agreeable with plan. Final Clinical Impressions(s) / UC Diagnoses   Final diagnoses:  Facial cellulitis  Swelling of left eyelid     Discharge Instructions      It appears that you have an infection of your eye.  You were given an antibiotic injection  in urgent care as well as steroid injection.  You were sent home with antihistamine allergy medication as well as another antibiotic.  Please start taking these immediately.  Please go to the hospital if symptoms do not improve  or if they worsen in the next 24 hours or if you develop fever.  This is very important.    ED Prescriptions     Medication Sig Dispense Auth. Provider   doxycycline (VIBRAMYCIN) 100 MG capsule Take 1 capsule (100 mg total) by mouth 2 (two) times daily. 20 capsule [place], [place], [place]   cetirizine (ZYRTEC) 10 MG tablet Take 1 tablet (10 mg total) by mouth daily. 30 tablet [place], Acie Fredrickson, [place]      PDMP not reviewed this encounter.   Gustavus Bryant, [place] 09/07/21 1745

## 2022-01-20 ENCOUNTER — Other Ambulatory Visit: Payer: Self-pay

## 2022-01-20 ENCOUNTER — Ambulatory Visit (HOSPITAL_COMMUNITY)
Admission: EM | Admit: 2022-01-20 | Discharge: 2022-01-20 | Disposition: A | Discharge status: Home / Self Care | Payer: Medicaid Other | Attending: Physician Assistant | Admitting: Physician Assistant

## 2022-01-20 ENCOUNTER — Encounter (HOSPITAL_COMMUNITY): Payer: Self-pay | Admitting: Emergency Medicine

## 2022-01-20 DIAGNOSIS — Z1152 Encounter for screening for COVID-19: Secondary | ICD-10-CM | POA: Diagnosis not present

## 2022-01-20 DIAGNOSIS — J069 Acute upper respiratory infection, unspecified: Secondary | ICD-10-CM | POA: Diagnosis not present

## 2022-01-20 LAB — RESP PANEL BY RT-PCR (FLU A&B, COVID) ARPGX2
Influenza A by PCR: NEGATIVE
Influenza B by PCR: NEGATIVE
SARS Coronavirus 2 by RT PCR: NEGATIVE

## 2022-01-20 MED ORDER — CETIRIZINE HCL 10 MG PO TABS: 10.0000 mg | ORAL_TABLET | Freq: Every day | ORAL | 0 refills | Status: DC

## 2022-01-20 MED ORDER — FLUTICASONE PROPIONATE 50 MCG/ACT NA SUSP: 1.0000 | Freq: Every day | NASAL | 0 refills | Status: DC

## 2022-01-20 MED ORDER — PROMETHAZINE-DM 6.25-15 MG/5ML PO SYRP: 5.0000 mL | ORAL_SOLUTION | Freq: Four times a day (QID) | ORAL | 0 refills | Status: DC | PRN

## 2022-01-20 NOTE — ED Triage Notes (Signed)
Patient presents to [facility] for evaluation of sore throat, runny nose, coughing and sneezing since Friday.  Denies fever or n/v.

## 2022-01-20 NOTE — Discharge Instructions (Signed)
I believe that you have a virus.  We will contact you if you are positive for COVID or flu.  Monitor your MyChart for these results.  Use cetirizine and Flonase to help with your congestion.  Take Promethazine DM up to 4 times daily for cough.  This will make you sleepy so do not drive or drink alcohol with taking it.  Make sure you are resting and drinking plenty of fluid.  If your symptoms are improving by next week return for reevaluation.  If you have any worsening symptoms including chest pain, shortness of breath, nausea/vomiting interfering with oral intake, weakness you need to be seen immediately.

## 2022-01-20 NOTE — ED Provider Notes (Signed)
[facility]    CSN: [id number] Arrival date & time: 01/20/22  1313      History   Chief Complaint Chief Complaint  Patient presents with   URI    HPI Benjamin Pacheco is a 21 y.o. male.   Patient presents today with a 3 to 4-day history of URI symptoms including sore throat, congestion, sneezing, cough.  Denies any fever, chest pain, shortness of breath, body aches.  Denies any known sick contacts.  Denies any recent antibiotics or steroids.  He has not had COVID-19 or flu vaccines.  He has had COVID with last episode 1 year ago.  He has not been trying any over-the-counter medication for symptom management.  Denies any significant past medical history including diabetes, asthma, COPD.    History reviewed. No pertinent past medical history.  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 No pertinent surgical history.     Home Medications    Prior to Admission medications   Medication Sig Start Date End Date Taking? Authorizing Provider  cetirizine (ZYRTEC ALLERGY) 10 MG tablet Take 1 tablet (10 mg total) by mouth at bedtime. 01/20/22  Yes Jalayla Chrismer K, PA-C  fluticasone (FLONASE) 50 MCG/ACT nasal spray Place 1 spray into both nostrils daily. 01/20/22  Yes Rossie Scarfone K, PA-C  promethazine-dextromethorphan (PROMETHAZINE-DM) 6.25-15 MG/5ML syrup Take 5 mLs by mouth 4 (four) times daily as needed for cough. 01/20/22  Yes Lanson Randle, Noberto Retort, PA-C    Family History Family History  Family history unknown: Yes    Social History Social History   Tobacco Use   Smoking status: Never   Smokeless tobacco: Never  Vaping Use   Vaping Use: Never used  Substance Use Topics   Alcohol use: No     Alcohol/week: 0.0 standard drinks of alcohol   Drug use: No     Allergies   Patient has no known allergies.   Review of Systems Review of Systems  Constitutional:  Positive for activity change and fatigue. Negative for appetite change and fever.  HENT:  Positive for congestion, sinus pressure and sore throat. Negative for sneezing.   Respiratory:  Positive for cough. Negative for shortness of breath.   Cardiovascular:  Negative for chest pain.  Gastrointestinal:  Negative for abdominal pain, diarrhea, nausea and vomiting.  Neurological:  Positive for headaches. Negative for dizziness and light-headedness.     Physical Exam Triage Vital Signs ED Triage Vitals [01/20/22 1429]  Enc Vitals Group     BP 117/66     Pulse Rate (!) 111     Resp 18     Temp 98 F (36.7 C)     Temp Source Oral     SpO2 98 %     Weight      Height      Head Circumference      Peak Flow      Pain Score 0     Pain Loc      Pain Edu?      Excl. in [place]?    No data found.  Updated Vital Signs BP 117/66 (BP Location: Left Arm)   Pulse (!) 58   Temp 98 F (36.7 C) (Oral)  Resp 18   SpO2 99%   Visual Acuity Right Eye Distance:   Left Eye Distance:   Bilateral Distance:    Right Eye Near:   Left Eye Near:    Bilateral Near:     Physical Exam Vitals reviewed.  Constitutional:      General: He is awake.     Appearance: Normal appearance. He is well-developed. He is not ill-appearing.     Comments: Very pleasant male appears stated age in no acute distress sitting comfortably in exam room  HENT:     Head: Normocephalic and atraumatic.     Right Ear: Tympanic membrane, ear canal and external ear normal. Tympanic membrane is not erythematous or bulging.     Left Ear: Ear canal and external ear normal. A middle ear effusion is present. Tympanic membrane is not erythematous or bulging.     Nose:     Right Sinus: Maxillary sinus tenderness present. No frontal sinus tenderness.     Left  Sinus: Maxillary sinus tenderness present. No frontal sinus tenderness.     Mouth/Throat:     Pharynx: Uvula midline. Posterior oropharyngeal erythema present. No oropharyngeal exudate.  Cardiovascular:     Rate and Rhythm: Normal rate and regular rhythm.     Heart sounds: Normal heart sounds, S1 normal and S2 normal. No murmur heard. Pulmonary:     Effort: Pulmonary effort is normal. No accessory muscle usage or respiratory distress.     Breath sounds: Normal breath sounds. No stridor. No wheezing, rhonchi or rales.     Comments: Clear to auscultation bilaterally Neurological:     Mental Status: He is alert.  Psychiatric:        Behavior: Behavior is cooperative.      UC Treatments / Results  Labs (all labs ordered are listed, but only abnormal results are displayed) Labs Reviewed  RESP PANEL BY RT-PCR (FLU A&B, COVID) [id number]    EKG   Radiology No results found.  Procedures Procedures (including critical care time)  Medications Ordered in UC Medications - No data to display  Initial Impression / Assessment and Plan / UC Course  I have reviewed the triage vital signs and the nursing notes.  Pertinent labs & imaging results that were available during my care of the patient were reviewed by me and considered in my medical decision making (see chart for details).     Patient is well-appearing, afebrile, nontoxic, nontachycardic.  No evidence of acute infection on physical exam that would warrant initiation of antibiotics.  Discussed likely viral etiology of symptoms.  COVID/flu testing was obtained today-results pending.  Patient is to monitor his MyChart for these results.  Recommended conservative treatment measures and he was started on cetirizine and Flonase for congestion.  He was prescribed Promethazine DM for cough with instruction not to drive drink alcohol with taking this medication.  Work excuse note provided with current CDC return to work guidelines based on  COVID test result.  He is to rest and drink plenty of fluid.  Discussed that if symptoms or not proving by next week he needs to return for reevaluation.  If he has any worsening symptoms he needs to go to the emergency room including shortness of breath, chest pain, nausea/vomiting interfering with oral intake, weakness.  Strict return precautions given.  Final Clinical Impressions(s) / UC Diagnoses   Final diagnoses:  Upper respiratory tract infection, unspecified type     Discharge Instructions      I believe  that you have a virus.  We will contact you if you are positive for COVID or flu.  Monitor your MyChart for these results.  Use cetirizine and Flonase to help with your congestion.  Take Promethazine DM up to 4 times daily for cough.  This will make you sleepy so do not drive or drink alcohol with taking it.  Make sure you are resting and drinking plenty of fluid.  If your symptoms are improving by next week return for reevaluation.  If you have any worsening symptoms including chest pain, shortness of breath, nausea/vomiting interfering with oral intake, weakness you need to be seen immediately.     ED Prescriptions     Medication Sig Dispense Auth. Provider   promethazine-dextromethorphan (PROMETHAZINE-DM) 6.25-15 MG/5ML syrup Take 5 mLs by mouth 4 (four) times daily as needed for cough. 118 mL Chauncey Sciulli K, PA-C   fluticasone (FLONASE) 50 MCG/ACT nasal spray Place 1 spray into both nostrils daily. 16 g Kevionna Heffler K, PA-C   cetirizine (ZYRTEC ALLERGY) 10 MG tablet Take 1 tablet (10 mg total) by mouth at bedtime. 30 tablet Ninnie Fein, Noberto Retort, PA-C      PDMP not reviewed this encounter.   Jeani Hawking, PA-C 01/20/22 1446

## 2022-03-09 ENCOUNTER — Ambulatory Visit (HOSPITAL_COMMUNITY)
Admission: EM | Admit: 2022-03-09 | Discharge: 2022-03-09 | Disposition: A | Discharge status: Home / Self Care | Payer: Medicaid Other | Attending: Emergency Medicine | Admitting: Emergency Medicine

## 2022-03-09 ENCOUNTER — Other Ambulatory Visit: Payer: Self-pay

## 2022-03-09 ENCOUNTER — Encounter (HOSPITAL_COMMUNITY): Payer: Self-pay | Admitting: Emergency Medicine

## 2022-03-09 DIAGNOSIS — Z113 Encounter for screening for infections with a predominantly sexual mode of transmission: Secondary | ICD-10-CM | POA: Diagnosis not present

## 2022-03-09 DIAGNOSIS — Z202 Contact with and (suspected) exposure to infections with a predominantly sexual mode of transmission: Secondary | ICD-10-CM | POA: Diagnosis not present

## 2022-03-09 NOTE — ED Triage Notes (Signed)
Patient denies std symptoms, "just a regular check up"

## 2022-03-09 NOTE — ED Provider Notes (Signed)
Benjamin Pacheco    CSN: [id number] Arrival date & time: 03/09/22  1206      History   Chief Complaint Chief Complaint  Patient presents with   SEXUALLY TRANSMITTED DISEASE    HPI ADDAM GOELLER is a 22 y.o. male.  Patient presents requesting STD screening.  Patient denies any known exposure to an STD.  Patient denies any penile discharge, penile pain, dysuria, testicular pain, pelvic pain, abdominal pain, rash, lesion, nausea, vomiting, fever, chills, any systemic symptoms, and any STD related symptoms.  Patient reports that he wants the cytology swab.  Patient states that he does not want any blood work performed.  HPI  History reviewed. No pertinent past medical history.  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 No pertinent surgical history.     Home Medications    Prior to Admission medications   Not on File    Family History Family History  Family history unknown: Yes    Social History Social History   Tobacco Use   Smoking status: Never   Smokeless tobacco: Never  Vaping Use   Vaping Use: Never used  Substance Use Topics   Alcohol use: No    Alcohol/week: 0.0 standard drinks of alcohol   Drug use: No     Allergies   Patient has no known allergies.   Review of Systems Review of Systems Per HPI  Physical Exam Triage Vital Signs ED Triage Vitals  Enc Vitals Group     BP 03/09/22 1441 124/72     Pulse Rate 03/09/22 1441 73     Resp 03/09/22 1441 18     Temp 03/09/22 1441 98.5 F (36.9 C)     Temp Source 03/09/22 1441 Oral     SpO2 03/09/22 1441 96 %     Weight --      Height --      Head Circumference --      Peak Flow --      Pain Score 03/09/22 1440 0      Pain Loc --      Pain Edu? --      Excl. in [place]? --    No data found.  Updated Vital Signs BP 124/72 (BP Location: Left Arm)   Pulse 73   Temp 98.5 F (36.9 C) (Oral)   Resp 18   SpO2 96%   Physical Exam Vitals and nursing note reviewed.  Constitutional:      Appearance: Normal appearance.  Genitourinary:    Comments: Deferred exam Neurological:     Mental Status: He is alert.  Psychiatric:        Behavior: Behavior is cooperative.      UC Treatments / Results  Labs (all labs ordered are listed, but only abnormal results are displayed) Labs Reviewed  CYTOLOGY, (ORAL, ANAL, URETHRAL) ANCILLARY ONLY    EKG   Radiology No results found.  Procedures Procedures (including critical care time)  Medications Ordered in UC Medications - No data to display  Initial Impression / Assessment and Plan / UC Course  I have reviewed the triage vital signs and the nursing notes.  Pertinent labs & imaging results that were available during my care of  the patient were reviewed by me and considered in my medical decision making (see chart for details).     Patient evaluated for STD screening.  Cytology swab is pending.  Patient declined blood work.  Patient was made aware of results reporting protocol and MyChart.  Patient verbalized understanding of instructions.  Charting was provided using a a verbal dictation system, charting was proofread for errors, errors may occur which could change the meaning of the information charted.   Final Clinical Impressions(s) / UC Diagnoses   Final diagnoses:  Screening for STD (sexually transmitted disease)     Discharge Instructions      We will call you if any of your test results warrant a change in your plan of care, you may view these test results on MyChart     ED Prescriptions   None    PDMP not reviewed this encounter.   Flossie Dibble, NP 03/09/22 1551

## 2022-03-09 NOTE — Discharge Instructions (Addendum)
We will call you if any of your test results warrant a change in your plan of care, you may view these test results on MyChart

## 2022-03-10 LAB — CYTOLOGY, (ORAL, ANAL, URETHRAL) ANCILLARY ONLY
Chlamydia: POSITIVE — AB
Comment: NEGATIVE
Comment: NEGATIVE
Comment: NORMAL
Neisseria Gonorrhea: NEGATIVE
Trichomonas: NEGATIVE

## 2022-03-11 ENCOUNTER — Telehealth (HOSPITAL_COMMUNITY): Payer: Self-pay | Admitting: Emergency Medicine

## 2022-03-11 MED ORDER — DOXYCYCLINE HYCLATE 100 MG PO CAPS: 100.0000 mg | ORAL_CAPSULE | Freq: Two times a day (BID) | ORAL | 0 refills | Status: AC

## 2022-05-01 ENCOUNTER — Ambulatory Visit (HOSPITAL_COMMUNITY)
Admission: EM | Admit: 2022-05-01 | Discharge: 2022-05-01 | Disposition: A | Discharge status: Home / Self Care | Payer: Medicaid Other | Attending: Internal Medicine | Admitting: Internal Medicine

## 2022-05-01 ENCOUNTER — Encounter (HOSPITAL_COMMUNITY): Payer: Self-pay | Admitting: Emergency Medicine

## 2022-05-01 DIAGNOSIS — L089 Local infection of the skin and subcutaneous tissue, unspecified: Secondary | ICD-10-CM

## 2022-05-01 DIAGNOSIS — L723 Sebaceous cyst: Secondary | ICD-10-CM | POA: Diagnosis not present

## 2022-05-01 MED ORDER — IBUPROFEN 600 MG PO TABS: 600.0000 mg | ORAL_TABLET | Freq: Four times a day (QID) | ORAL | 0 refills | Status: DC | PRN

## 2022-05-01 MED ORDER — AMOXICILLIN-POT CLAVULANATE 875-125 MG PO TABS: 1.0000 | ORAL_TABLET | Freq: Two times a day (BID) | ORAL | 0 refills | Status: DC

## 2022-05-01 NOTE — Discharge Instructions (Addendum)
Please take antibiotics as prescribed Warm compresses 2-3 times a day Ibuprofen as needed for fever and/or chills Once the infection subsides, you may follow-up with the general surgeon or dermatologist to be evaluated for cyst removal.

## 2022-05-01 NOTE — ED Provider Notes (Signed)
[facility]    CSN: [id number] Arrival date & time: 05/01/22  [id number]      History   Chief Complaint Chief Complaint  Patient presents with   Facial Swelling    HPI Benjamin Pacheco is a 22 y.o. male comes to the urgent care with a 1 day history of painful swelling over the right eyebrow.  Patient's symptoms started yesterday and has been persistent.  He has noticed some purulent discharge from the swelling.  No trauma to the right eyebrow.  No fever or chills.  No visual changes.  No blurry vision or double vision.  Patient denies any headaches. HPI  History reviewed. No pertinent past medical history.  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 No pertinent surgical history.     Home Medications    Prior to Admission medications   Medication Sig Start Date End Date Taking? Authorizing Provider  amoxicillin-clavulanate (AUGMENTIN) 875-125 MG tablet Take 1 tablet by mouth every 12 (twelve) hours. 05/01/22  Yes Terrill Alperin, Myrene Galas, MD  ibuprofen (ADVIL) 600 MG tablet Take 1 tablet (600 mg total) by mouth every 6 (six) hours as needed. 05/01/22  Yes Wendel Homeyer, Myrene Galas, MD    Family History Family History  Family history unknown: Yes    Social History Social History   Tobacco Use   Smoking status: Never   Smokeless tobacco: Never  Vaping Use   Vaping Use: Never used  Substance Use Topics   Alcohol use: No    Alcohol/week: 0.0 standard drinks of alcohol   Drug use: No     Allergies   Patient has no known allergies.   Review of Systems Review of Systems As per HPI  Physical Exam Triage Vital Signs ED Triage Vitals [05/01/22 1014]  Enc Vitals Group     BP 103/69     Pulse Rate (!) 58      Resp 14     Temp 98.2 F (36.8 C)     Temp Source Oral     SpO2 97 %     Weight      Height      Head Circumference      Peak Flow      Pain Score      Pain Loc      Pain Edu?      Excl. in [place]?    No data found.  Updated Vital Signs BP 103/69 (BP Location: Left Arm)   Pulse (!) 58   Temp 98.2 F (36.8 C) (Oral)   Resp 14   SpO2 97%   Visual Acuity Right Eye Distance:   Left Eye Distance:   Bilateral Distance:    Right Eye Near:   Left Eye Near:    Bilateral Near:     Physical Exam Vitals and nursing note reviewed.  Constitutional:      General: He is not in acute distress.    Appearance: Normal appearance. He is not ill-appearing or diaphoretic.  Cardiovascular:     Rate and Rhythm: Normal rate and regular rhythm.  Skin:    Comments: Cystic swelling over the right eyebrow.  Swelling is not attached to underlying structures.  It is freely mobile.  It has a smooth surface and measures about 1 inch in the longest diameter.  I was able to express some cheesy discharge from the swelling.  No surrounding erythema   Neurological:     Mental Status: He is alert.      UC Treatments / Results  Labs (all labs ordered are listed, but only abnormal results are displayed) Labs Reviewed - No data to display  EKG   Radiology No results found.  Procedures Procedures (including critical care time)  Medications Ordered in UC Medications - No data to display  Initial Impression / Assessment and Plan / UC Course  I have reviewed the triage vital signs and the nursing notes.  Pertinent labs & imaging results that were available during my care of the patient were reviewed by me and considered in my medical decision making (see chart for details).     1.  Infected sebaceous cyst of the skin: Augmentin twice daily for 7 days Ibuprofen 3 times daily as needed for pain and/or fever Warm compresses 2-3 times a day Return precautions given Once the infection subsides,  patient may follow-up with the dermatologist or surgeon to have the cyst removed. Final Clinical Impressions(s) / UC Diagnoses   Final diagnoses:  Infected sebaceous cyst of skin     Discharge Instructions      Please take antibiotics as prescribed Warm compresses 2-3 times a day Ibuprofen as needed for fever and/or chills Once the infection subsides, you may follow-up with the general surgeon or dermatologist to be evaluated for cyst removal.   ED Prescriptions     Medication Sig Dispense Auth. Provider   amoxicillin-clavulanate (AUGMENTIN) 875-125 MG tablet Take 1 tablet by mouth every 12 (twelve) hours. 14 tablet Safi Culotta, Myrene Galas, MD   ibuprofen (ADVIL) 600 MG tablet Take 1 tablet (600 mg total) by mouth every 6 (six) hours as needed. 30 tablet Richrd Kuzniar, Myrene Galas, MD      PDMP not reviewed this encounter.   Chase Picket, MD 05/01/22 1101

## 2022-05-01 NOTE — ED Triage Notes (Signed)
Pt has swelling in right eyebrow line for a couple days. Reports some pus did come out. Put antibiotics cream on area

## 2022-12-27 ENCOUNTER — Other Ambulatory Visit: Payer: Self-pay

## 2022-12-27 ENCOUNTER — Encounter (HOSPITAL_COMMUNITY): Payer: Self-pay | Admitting: Emergency Medicine

## 2022-12-27 ENCOUNTER — Ambulatory Visit (HOSPITAL_COMMUNITY)
Admission: EM | Admit: 2022-12-27 | Discharge: 2022-12-27 | Disposition: A | Discharge status: Home / Self Care | Payer: Medicaid Other | Attending: Family Medicine | Admitting: Family Medicine

## 2022-12-27 DIAGNOSIS — N492 Inflammatory disorders of scrotum: Secondary | ICD-10-CM

## 2022-12-27 MED ORDER — LIDOCAINE HCL 2 % IJ SOLN
INTRAMUSCULAR | Status: AC
  Filled 2022-12-27: qty 20

## 2022-12-27 MED ORDER — DOXYCYCLINE HYCLATE 100 MG PO CAPS: 100.0000 mg | ORAL_CAPSULE | Freq: Two times a day (BID) | ORAL | 0 refills | Status: DC

## 2022-12-27 MED ORDER — IBUPROFEN 800 MG PO TABS: 800.0000 mg | ORAL_TABLET | Freq: Three times a day (TID) | ORAL | 0 refills | Status: DC

## 2022-12-27 NOTE — ED Notes (Signed)
At bedside for provider I/D of abscess.

## 2022-12-27 NOTE — ED Provider Notes (Signed)
  [facility]   [id number] 12/27/22 Arrival Time: 1006  ASSESSMENT & PLAN:  1. Scrotal abscess     Incision and Drainage Procedure Note  Anesthesia: 1% plain lidocaine  Procedure Details  The procedure, risks and complications have been discussed in detail (including, but not limited to pain and bleeding) with the patient.  The skin induration was prepped and draped in the usual fashion. After adequate local anesthesia, I&D with a #11 blade was performed on the right superior posterior scrotum with copious, bloody, purulent drainage.  EBL: minimal Drains: none Packing: 1/4" iodoform  Condition: Tolerated procedure well Complications: none.  Meds ordered this encounter  Medications   ibuprofen (ADVIL) 800 MG tablet    Sig: Take 1 tablet (800 mg total) by mouth 3 (three) times daily with meals.    Dispense:  21 tablet    Refill:  0   doxycycline (VIBRAMYCIN) 100 MG capsule    Sig: Take 1 capsule (100 mg total) by mouth 2 (two) times daily.    Dispense:  14 capsule    Refill:  0    Wound care instructions discussed and given in written format. To return in 48 hours for wound check.  Finish all antibiotics. OTC analgesics as needed.  Reviewed expectations re: course of current medical issues. Questions answered. Outlined signs and symptoms indicating need for more acute intervention. Patient verbalized understanding. After Visit Summary given.   SUBJECTIVE:  Benjamin Pacheco is a 22 y.o. male who presents with a pain of his R scrotum. Onset gradual, approximately a few days ago without active drainage and without active bleeding. Symptoms have gradually worsened (pain and swelling) since beginning. Fever: denies. No tx PTA.   OBJECTIVE:  Vitals:   12/27/22 1051  BP: 124/84  Pulse: 69  Resp: 18  Temp: 98.4 F (36.9 C)  TempSrc: Oral  SpO2: 98%     General appearance: alert; no distress GU: approx 1.5 x 2 cm induration of his RIGHT superior posterior  scrotum; no other masses or abnormalities appreciated; tender to touch; no active drainage or bleeding Psychological: alert and cooperative; normal mood and affect  No Known Allergies  History reviewed. No pertinent past medical history. Social History   Socioeconomic History   Marital status: Single    Spouse name: Not on file   Number of children: Not on file   Years of education: Not on file   Highest education level: Not on file  Occupational History   Not on file  Tobacco Use   Smoking status: Never   Smokeless tobacco: Never  Vaping Use   Vaping status: Never Used  Substance and Sexual Activity   Alcohol use: No    Alcohol/week: 0.0 standard drinks of alcohol   Drug use: No   Sexual activity: Not on file  Other Topics Concern   Not on file  Social History Narrative   Not on file   Social Determinants of Health   Financial Resource Strain: Not on file  Food Insecurity: Not on file  Transportation Needs: Not on file  Physical Activity: Not on file  Stress: Not on file  Social Connections: Not on file   Family History  Family history unknown: Yes   History reviewed. No pertinent surgical history.          Mardella Layman, MD 12/27/22 1209

## 2022-12-27 NOTE — ED Triage Notes (Signed)
Testicular swelling and pain noticed 2 days ago.  Patient plays soccer, after practice was very tired and slept.  Pain has increased as well as swelling denies any issues urinating.  Right testicle is involved.  Patient admits he recalls a particular awkward stretching of right leg on the field during practice 2 days ago

## 2022-12-29 ENCOUNTER — Encounter (HOSPITAL_COMMUNITY): Payer: Self-pay

## 2022-12-29 ENCOUNTER — Ambulatory Visit (HOSPITAL_COMMUNITY)
Admission: EM | Admit: 2022-12-29 | Discharge: 2022-12-29 | Disposition: A | Discharge status: Home / Self Care | Payer: 59 | Attending: Family Medicine | Admitting: Family Medicine

## 2022-12-29 DIAGNOSIS — N492 Inflammatory disorders of scrotum: Secondary | ICD-10-CM

## 2022-12-29 MED ORDER — DOXYCYCLINE HYCLATE 100 MG PO CAPS: 100.0000 mg | ORAL_CAPSULE | Freq: Two times a day (BID) | ORAL | 0 refills | Status: DC

## 2022-12-29 NOTE — Discharge Instructions (Signed)
The packing from your wound has been removed.

## 2022-12-29 NOTE — ED Provider Notes (Signed)
  [facility]   [id number] 12/29/22 Arrival Time: 2841  ASSESSMENT & PLAN:  1. Scrotal abscess    Follow up. Did not fill Doxy for some reason. I have resent. Packing removed from wound. No need to re-pack. Afebrile. Begin: Meds ordered this encounter  Medications   doxycycline (VIBRAMYCIN) 100 MG capsule    Sig: Take 1 capsule (100 mg total) by mouth 2 (two) times daily.    Dispense:  14 capsule    Refill:  0     SUBJECTIVE:  Benjamin Pacheco is a 22 y.o. male who is here for f/u s/p I&D on scrotum by me.   OBJECTIVE:  Vitals:   12/29/22 0916  BP: 117/66  Pulse: 66  Resp: 14  Temp: 98.1 F (36.7 C)  SpO2: 98%    General appearance: alert; no distress GU: R superior posterior wound looks good; much less swelling; just the slightest erythema; no active drainage after packing removal Psychological: alert and cooperative; normal mood and affect  No Known Allergies  History reviewed. No pertinent past medical history. Social History   Socioeconomic History   Marital status: Single    Spouse name: Not on file   Number of children: Not on file   Years of education: Not on file   Highest education level: Not on file  Occupational History   Not on file  Tobacco Use   Smoking status: Never   Smokeless tobacco: Never  Vaping Use   Vaping status: Never Used  Substance and Sexual Activity   Alcohol use: No    Alcohol/week: 0.0 standard drinks of alcohol   Drug use: No   Sexual activity: Not on file  Other Topics Concern   Not on file  Social History Narrative   Not on file   Social Determinants of Health   Financial Resource Strain: Not on file  Food Insecurity: Not on file  Transportation Needs: Not on file  Physical Activity: Not on file  Stress: Not on file  Social Connections: Not on file   Family History  Family history unknown: Yes   History reviewed. No pertinent surgical history.          Mardella Layman, MD 12/29/22 1023

## 2022-12-29 NOTE — ED Triage Notes (Addendum)
Pt presents for a wound follow-up today. Pt states "I was seen here on Saturday for pain and swelling in my prostate area. Dr. Tracie Harrier told me to follow-up on Monday (today)." Pt is still having pain in prostate area, however reports "not as much swelling." Pt taking ibuprofen three times a day for pain. Last dose of Ibuprofen was this AM. Pt denies dysuria.   Pt states he never took the vibramycin prescribed, states "it was not filled at pharmacy."

## 2023-03-23 ENCOUNTER — Encounter (HOSPITAL_COMMUNITY): Payer: Self-pay

## 2023-03-23 ENCOUNTER — Ambulatory Visit (HOSPITAL_COMMUNITY)
Admission: EM | Admit: 2023-03-23 | Discharge: 2023-03-23 | Disposition: A | Discharge status: Home / Self Care | Payer: 59 | Attending: Family Medicine | Admitting: Family Medicine

## 2023-03-23 DIAGNOSIS — H1131 Conjunctival hemorrhage, right eye: Secondary | ICD-10-CM | POA: Diagnosis not present

## 2023-03-23 DIAGNOSIS — H00011 Hordeolum externum right upper eyelid: Secondary | ICD-10-CM

## 2023-03-23 NOTE — ED Triage Notes (Signed)
Pt c/o stye to rt upper eye lid x3 days. States using warm compresses and hot tea bags with no relief.

## 2023-03-23 NOTE — Discharge Instructions (Addendum)
It appears that you have a stye and subconjunctival hemorrhage. These can appear alarming but usually resolve with conservative measures.  You can use warm compresses and gentle massage along the eyelid to help with the stye. If you notice swelling around the eye, difficulty seeing or moving you eye, fevers- you should go to the ED. For the hemorrhage, you can use lubricating eye drops like Rephresh or Blink tears to help with irritation. I do not recommend using Visine products for this. Both conditions should begin to resolve in the next 1-2 weeks but if you continue to have concerns, please follow up with your PCP.

## 2023-03-23 NOTE — ED Provider Notes (Signed)
[facility]    CSN: [id number] Arrival date & time: 03/23/23  1530      History   Chief Complaint Chief Complaint  Patient presents with   Eye Pain    HPI Benjamin Pacheco is a 23 y.o. male.   HPI  He reports he woke up and noticed right upper eyelid swelling and tenderness about 2 days ago   He has been applying warm compresses to the area and notes some watery drainage and some pain with opening/closing eye He denies trauma to the area, foreign body sensation or purulent drainage He is not a contact lens wearer   History reviewed. No pertinent past medical history.  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 No pertinent surgical history.     Home Medications    Prior to Admission medications   Not on File    Family History Family History  Family history unknown: Yes    Social History Social History   Tobacco Use   Smoking status: Never   Smokeless tobacco: Never  Vaping Use   Vaping status: Never Used  Substance Use Topics   Alcohol use: No    Alcohol/week: 0.0 standard drinks of alcohol   Drug use: No     Allergies   Patient has no known allergies.   Review of Systems Review of Systems  Eyes:  Positive for pain and redness.     Physical Exam Triage Vital Signs ED Triage Vitals [03/23/23 1800]  Encounter Vitals Group     BP 114/67     Systolic BP Percentile      Diastolic BP Percentile      Pulse Rate (!) 58     Resp 18     Temp 98.4 F (36.9 C)     Temp Source Oral     SpO2 96 %     Weight      Height      Head Circumference      Peak Flow      Pain Score 6     Pain Loc      Pain Education      Exclude from Growth Chart    No data  found.  Updated Vital Signs BP 114/67 (BP Location: Left Arm)   Pulse (!) 58   Temp 98.4 F (36.9 C) (Oral)   Resp 18   SpO2 96%   Visual Acuity Right Eye Distance: 20/30 Left Eye Distance: 20/20 Bilateral Distance: 20/20  Right Eye Near:   Left Eye Near:    Bilateral Near:     Physical Exam Vitals reviewed.  Constitutional:      General: He is awake.     Appearance: Normal appearance. He is well-developed and well-groomed.  HENT:     Head: Normocephalic and atraumatic. No right periorbital erythema or left periorbital erythema.  Eyes:     General: Lids are normal. Gaze aligned appropriately.        Right eye: Discharge and hordeolum present. No foreign body.        Left eye: No foreign body, discharge or hordeolum.     Extraocular Movements: Extraocular movements intact.     Right eye: Normal extraocular motion  and no nystagmus.     Left eye: Normal extraocular motion and no nystagmus.     Conjunctiva/sclera:     Right eye: Hemorrhage present.     Pupils: Pupils are equal, round, and reactive to light.  Pulmonary:     Effort: Pulmonary effort is normal.  Musculoskeletal:     Cervical back: Normal range of motion and neck supple.  Neurological:     Mental Status: He is alert.  Psychiatric:        Attention and Perception: Attention normal.        Mood and Affect: Mood normal.        Behavior: Behavior normal. Behavior is cooperative.      UC Treatments / Results  Labs (all labs ordered are listed, but only abnormal results are displayed) Labs Reviewed - No data to display  EKG   Radiology No results found.  Procedures Procedures (including critical care time)  Medications Ordered in UC Medications - No data to display  Initial Impression / Assessment and Plan / UC Course  I have reviewed the triage vital signs and the nursing notes.  Pertinent labs & imaging results that were available during my care of the patient were reviewed by me and  considered in my medical decision making (see chart for details).      Final Clinical Impressions(s) / UC Diagnoses   Final diagnoses:  Hordeolum externum of right upper eyelid  Subconjunctival hemorrhage of right eye   Acute, new concerns Presentation and HPI appear consistent with left sided hordeolum and subconjunctival hemorrhage Recommend warm compresses and lubricating eye drops for conservative management Reviewed return/ED precautions- periorbital swelling, vision changes, increased pain, fevers. Follow up as needed for persistent or progressing symptoms     Discharge Instructions      It appears that you have a stye and subconjunctival hemorrhage. These can appear alarming but usually resolve with conservative measures.  You can use warm compresses and gentle massage along the eyelid to help with the stye. If you notice swelling around the eye, difficulty seeing or moving you eye, fevers- you should go to the ED. For the hemorrhage, you can use lubricating eye drops like Rephresh or Blink tears to help with irritation. I do not recommend using Visine products for this. Both conditions should begin to resolve in the next 1-2 weeks but if you continue to have concerns, please follow up with your PCP.      ED Prescriptions   None    PDMP not reviewed this encounter.   Providence Crosby, PA-C 03/23/23 6962

## 2023-03-29 ENCOUNTER — Ambulatory Visit (HOSPITAL_COMMUNITY)
Admission: EM | Admit: 2023-03-29 | Discharge: 2023-03-29 | Disposition: A | Discharge status: Home / Self Care | Payer: 59 | Attending: Internal Medicine | Admitting: Internal Medicine

## 2023-03-29 ENCOUNTER — Encounter (HOSPITAL_COMMUNITY): Payer: Self-pay | Admitting: Emergency Medicine

## 2023-03-29 DIAGNOSIS — L03213 Periorbital cellulitis: Secondary | ICD-10-CM | POA: Diagnosis not present

## 2023-03-29 MED ORDER — AMOXICILLIN-POT CLAVULANATE 875-125 MG PO TABS: 1.0000 | ORAL_TABLET | Freq: Two times a day (BID) | ORAL | 0 refills | Status: AC

## 2023-03-29 NOTE — ED Triage Notes (Signed)
Pt reports was seen here last week for swelling to his right eye. Was told to do warm compresses for swelling. Denies any injury. Reports swelling has gone down put pain is worse. Denies any visual impairment.

## 2023-03-29 NOTE — ED Provider Notes (Signed)
[facility]    CSN: [id number] Arrival date & time: 03/29/23  1628      History   Chief Complaint Chief Complaint  Patient presents with   Eye Pain    HPI Benjamin Pacheco is a 23 y.o. male.   23 year old male who presents to urgent care with worsening eye symptoms.  He was seen on January 27 and diagnosed with a subconjunctival hemorrhage and a stye on the upper eyelid.  He reports that those symptoms are improving however now he has developed swelling and pain along the lower aspect of his eye on the eyelid and around the lower aspect of the eye.  He denies any vision changes.  He denies any discharge or redness of the eye.  He is using lubricating eyedrops.  He is having some tenderness along the lower part of the eye as well but not in the eye itself.  He denies fevers, chills, congestion or other associated symptoms.   Eye Pain Pertinent negatives include no chest pain, no abdominal pain and no shortness of breath.    History reviewed. No pertinent past medical history.  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 No pertinent surgical history.     Home Medications    Prior to Admission medications   Medication Sig Start Date End Date Taking? Authorizing Provider  amoxicillin-clavulanate (AUGMENTIN) 875-125 MG tablet Take 1 tablet by mouth every 12 (twelve) hours. 03/29/23  Yes Landis Martins, PA-C    Family History Family History  Family history unknown: Yes    Social History Social History   Tobacco Use   Smoking status: Never   Smokeless tobacco: Never  Vaping Use   Vaping status: Never Used  Substance Use Topics   Alcohol use: No    Alcohol/week: 0.0 standard drinks of  alcohol   Drug use: No     Allergies   Patient has no known allergies.   Review of Systems Review of Systems  Constitutional:  Negative for chills and fever.  HENT:  Negative for ear pain and sore throat.   Eyes:  Positive for pain. Negative for visual disturbance.       Swelling and redness around the eye on lower eye lid  Respiratory:  Negative for cough and shortness of breath.   Cardiovascular:  Negative for chest pain and palpitations.  Gastrointestinal:  Negative for abdominal pain and vomiting.  Genitourinary:  Negative for dysuria and hematuria.  Musculoskeletal:  Negative for arthralgias and back pain.  Skin:  Negative for color change and rash.  Neurological:  Negative for seizures and syncope.  All other systems reviewed and are negative.    Physical Exam Triage Vital Signs ED Triage Vitals  Encounter Vitals Group     BP 03/29/23 1702 105/62     Systolic BP Percentile --      Diastolic BP Percentile --      Pulse Rate 03/29/23 1702 (!) 57     Resp 03/29/23 1702 13     Temp 03/29/23 1702 (!) 97.4 F (36.3 C)     Temp Source 03/29/23 1702 Oral     SpO2 03/29/23 1702 100 %     Weight --  Height --      Head Circumference --      Peak Flow --      Pain Score 03/29/23 1703 8     Pain Loc --      Pain Education --      Exclude from Growth Chart --    No data found.  Updated Vital Signs BP 105/62 (BP Location: Left Arm)   Pulse (!) 57   Temp (!) 97.4 F (36.3 C) (Oral)   Resp 13   SpO2 100%   Visual Acuity Right Eye Distance:   Left Eye Distance:   Bilateral Distance:    Right Eye Near:   Left Eye Near:    Bilateral Near:     Physical Exam Vitals and nursing note reviewed.  Constitutional:      General: He is not in acute distress.    Appearance: He is well-developed.  HENT:     Head: Normocephalic and atraumatic.  Eyes:     General:        Right eye: No foreign body or discharge.        Left eye: No foreign body or discharge.      Extraocular Movements: Extraocular movements intact.     Right eye: Normal extraocular motion.     Conjunctiva/sclera: Conjunctivae normal.   Cardiovascular:     Rate and Rhythm: Normal rate and regular rhythm.     Heart sounds: No murmur heard. Pulmonary:     Effort: Pulmonary effort is normal. No respiratory distress.     Breath sounds: Normal breath sounds.  Abdominal:     Palpations: Abdomen is soft.     Tenderness: There is no abdominal tenderness.  Musculoskeletal:        General: No swelling.     Cervical back: Neck supple.  Skin:    General: Skin is warm and dry.     Capillary Refill: Capillary refill takes less than 2 seconds.  Neurological:     Mental Status: He is alert.  Psychiatric:        Mood and Affect: Mood normal.      UC Treatments / Results  Labs (all labs ordered are listed, but only abnormal results are displayed) Labs Reviewed - No data to display  EKG   Radiology No results found.  Procedures Procedures (including critical care time)  Medications Ordered in UC Medications - No data to display  Initial Impression / Assessment and Plan / UC Course  I have reviewed the triage vital signs and the nursing notes.  Pertinent labs & imaging results that were available during my care of the patient were reviewed by me and considered in my medical decision making (see chart for details).     Periorbital cellulitis of right eye  Symptoms most consistent with peri-orbital cellulitis. This is an infection in the soft tissue around the eye. This is treated with antibiotics by mouth. We will treat with the following:  Augmentin 875 mg twice daily for 7 days. This is an antibiotic.  Continue to use lubricating eye drops for the hemorrhage in the eye. This should continue to resolve but may take several more days or even a week to completely resolve. Return to urgent care or PCP if symptoms worsen or fail to resolve.     Final Clinical Impressions(s) /  UC Diagnoses   Final diagnoses:  Periorbital cellulitis of right eye     Discharge Instructions      Symptoms most consistent  with peri-orbital cellulitis. This is an infection in the soft tissue around the eye. This is treated with antibiotics by mouth. We will treat with the following:  Augmentin 875 mg twice daily for 7 days. This is an antibiotic.  Continue to use lubricating eye drops for the hemorrhage in the eye. This should continue to resolve but may take several more days or even a week to completely resolve. Return to urgent care or PCP if symptoms worsen or fail to resolve.     ED Prescriptions     Medication Sig Dispense Auth. Provider   amoxicillin-clavulanate (AUGMENTIN) 875-125 MG tablet Take 1 tablet by mouth every 12 (twelve) hours. 14 tablet Landis Martins, [place]      PDMP not reviewed this encounter.   Landis Martins, [place] 03/29/23 1735

## 2023-03-29 NOTE — Discharge Instructions (Addendum)
Symptoms most consistent with peri-orbital cellulitis. This is an infection in the soft tissue around the eye. This is treated with antibiotics by mouth. We will treat with the following:  Augmentin 875 mg twice daily for 7 days. This is an antibiotic.  Continue to use lubricating eye drops for the hemorrhage in the eye. This should continue to resolve but may take several more days or even a week to completely resolve. Return to urgent care or PCP if symptoms worsen or fail to resolve.

## 2023-08-04 ENCOUNTER — Encounter: Payer: Self-pay | Admitting: *Deleted
